# Patient Record
Sex: Female | Born: 1955
Health system: Southern US, Community
[De-identification: ages and names within clinical notes are randomized; demographics above are authoritative.]

## PROBLEM LIST (undated history)

## (undated) DIAGNOSIS — I2510 Atherosclerotic heart disease of native coronary artery without angina pectoris: Secondary | ICD-10-CM

## (undated) DIAGNOSIS — R002 Palpitations: Secondary | ICD-10-CM

## (undated) DIAGNOSIS — E119 Type 2 diabetes mellitus without complications: Secondary | ICD-10-CM

## (undated) DIAGNOSIS — I10 Essential (primary) hypertension: Secondary | ICD-10-CM

## (undated) DIAGNOSIS — E039 Hypothyroidism, unspecified: Secondary | ICD-10-CM

## (undated) DIAGNOSIS — E7800 Pure hypercholesterolemia, unspecified: Secondary | ICD-10-CM

## (undated) HISTORY — DX: Pure hypercholesterolemia, unspecified: E78.00

## (undated) HISTORY — DX: Essential (primary) hypertension: I10

## (undated) HISTORY — DX: Hypothyroidism, unspecified: E03.9

## (undated) HISTORY — PX: OTHER SURGICAL HISTORY: SHX169

## (undated) HISTORY — DX: Atherosclerotic heart disease of native coronary artery without angina pectoris: I25.10

## (undated) HISTORY — DX: Palpitations: R00.2

## (undated) HISTORY — PX: CARPAL TUNNEL RELEASE: SHX101

## (undated) HISTORY — DX: Type 2 diabetes mellitus without complications: E11.9

## (undated) HISTORY — PX: KNEE SURGERY: SHX244

---

## 2001-09-16 ENCOUNTER — Encounter: Admission: RE | Admit: 2001-09-16 | Discharge: 2001-09-16 | Payer: Self-pay | Admitting: Unknown Physician Specialty

## 2001-09-16 ENCOUNTER — Encounter: Payer: Self-pay | Admitting: Unknown Physician Specialty

## 2004-05-22 ENCOUNTER — Encounter: Admission: RE | Admit: 2004-05-22 | Discharge: 2004-05-22 | Payer: Self-pay | Admitting: Unknown Physician Specialty

## 2007-11-12 ENCOUNTER — Encounter: Admission: RE | Admit: 2007-11-12 | Discharge: 2007-11-12 | Payer: Self-pay | Admitting: Unknown Physician Specialty

## 2009-07-06 ENCOUNTER — Other Ambulatory Visit: Admission: RE | Admit: 2009-07-06 | Discharge: 2009-07-06 | Payer: Self-pay | Admitting: Family Medicine

## 2009-07-18 ENCOUNTER — Encounter: Admission: RE | Admit: 2009-07-18 | Discharge: 2009-07-18 | Payer: Self-pay | Admitting: Family Medicine

## 2010-08-28 ENCOUNTER — Encounter: Admission: RE | Admit: 2010-08-28 | Discharge: 2010-08-28 | Payer: Self-pay | Admitting: Family Medicine

## 2010-09-06 ENCOUNTER — Other Ambulatory Visit: Admission: RE | Admit: 2010-09-06 | Discharge: 2010-09-06 | Payer: Self-pay | Admitting: Family Medicine

## 2013-11-22 ENCOUNTER — Other Ambulatory Visit (HOSPITAL_COMMUNITY)
Admission: RE | Admit: 2013-11-22 | Discharge: 2013-11-22 | Disposition: A | Discharge status: Home / Self Care | Payer: BC Managed Care – PPO | Source: Ambulatory Visit | Attending: Family Medicine | Admitting: Family Medicine

## 2013-11-22 ENCOUNTER — Other Ambulatory Visit: Payer: Self-pay | Admitting: Family Medicine

## 2013-11-22 DIAGNOSIS — Z1151 Encounter for screening for human papillomavirus (HPV): Secondary | ICD-10-CM | POA: Insufficient documentation

## 2013-11-22 DIAGNOSIS — Z124 Encounter for screening for malignant neoplasm of cervix: Secondary | ICD-10-CM | POA: Insufficient documentation

## 2013-11-29 ENCOUNTER — Other Ambulatory Visit: Payer: Self-pay

## 2013-11-29 DIAGNOSIS — Z1231 Encounter for screening mammogram for malignant neoplasm of breast: Secondary | ICD-10-CM

## 2013-12-20 ENCOUNTER — Ambulatory Visit
Admission: RE | Admit: 2013-12-20 | Discharge: 2013-12-20 | Disposition: A | Discharge status: Home / Self Care | Payer: Self-pay | Source: Ambulatory Visit

## 2013-12-20 DIAGNOSIS — Z1231 Encounter for screening mammogram for malignant neoplasm of breast: Secondary | ICD-10-CM

## 2015-10-18 ENCOUNTER — Other Ambulatory Visit: Payer: Self-pay

## 2015-10-18 DIAGNOSIS — Z1231 Encounter for screening mammogram for malignant neoplasm of breast: Secondary | ICD-10-CM

## 2015-11-14 ENCOUNTER — Ambulatory Visit
Admission: RE | Admit: 2015-11-14 | Discharge: 2015-11-14 | Disposition: A | Discharge status: Home / Self Care | Payer: 59 | Source: Ambulatory Visit

## 2015-11-14 DIAGNOSIS — Z1231 Encounter for screening mammogram for malignant neoplasm of breast: Secondary | ICD-10-CM

## 2016-02-07 ENCOUNTER — Other Ambulatory Visit: Payer: Self-pay | Admitting: Gastroenterology

## 2016-03-31 DIAGNOSIS — E039 Hypothyroidism, unspecified: Secondary | ICD-10-CM | POA: Diagnosis not present

## 2016-07-02 DIAGNOSIS — E039 Hypothyroidism, unspecified: Secondary | ICD-10-CM | POA: Diagnosis not present

## 2016-12-08 ENCOUNTER — Other Ambulatory Visit (HOSPITAL_COMMUNITY)
Admission: RE | Admit: 2016-12-08 | Discharge: 2016-12-08 | Disposition: A | Discharge status: Home / Self Care | Payer: BLUE CROSS/BLUE SHIELD | Source: Ambulatory Visit | Attending: Family Medicine | Admitting: Family Medicine

## 2016-12-08 ENCOUNTER — Other Ambulatory Visit: Payer: Self-pay | Admitting: Family Medicine

## 2016-12-08 DIAGNOSIS — Z0000 Encounter for general adult medical examination without abnormal findings: Secondary | ICD-10-CM | POA: Diagnosis not present

## 2016-12-08 DIAGNOSIS — I10 Essential (primary) hypertension: Secondary | ICD-10-CM | POA: Diagnosis not present

## 2016-12-08 DIAGNOSIS — R7301 Impaired fasting glucose: Secondary | ICD-10-CM | POA: Diagnosis not present

## 2016-12-08 DIAGNOSIS — E039 Hypothyroidism, unspecified: Secondary | ICD-10-CM | POA: Diagnosis not present

## 2016-12-08 DIAGNOSIS — Z01411 Encounter for gynecological examination (general) (routine) with abnormal findings: Secondary | ICD-10-CM | POA: Diagnosis not present

## 2016-12-08 DIAGNOSIS — Z1151 Encounter for screening for human papillomavirus (HPV): Secondary | ICD-10-CM | POA: Insufficient documentation

## 2016-12-08 DIAGNOSIS — Z23 Encounter for immunization: Secondary | ICD-10-CM | POA: Diagnosis not present

## 2016-12-10 LAB — CYTOLOGY - PAP
Diagnosis: NEGATIVE
HPV: NOT DETECTED

## 2017-05-13 ENCOUNTER — Other Ambulatory Visit: Payer: Self-pay | Admitting: Family Medicine

## 2017-05-13 DIAGNOSIS — Z1231 Encounter for screening mammogram for malignant neoplasm of breast: Secondary | ICD-10-CM

## 2017-06-03 ENCOUNTER — Ambulatory Visit
Admission: RE | Admit: 2017-06-03 | Discharge: 2017-06-03 | Disposition: A | Discharge status: Home / Self Care | Payer: BLUE CROSS/BLUE SHIELD | Source: Ambulatory Visit | Attending: Family Medicine | Admitting: Family Medicine

## 2017-06-03 DIAGNOSIS — Z1231 Encounter for screening mammogram for malignant neoplasm of breast: Secondary | ICD-10-CM | POA: Diagnosis not present

## 2017-06-10 DIAGNOSIS — R7301 Impaired fasting glucose: Secondary | ICD-10-CM | POA: Diagnosis not present

## 2017-06-10 DIAGNOSIS — E039 Hypothyroidism, unspecified: Secondary | ICD-10-CM | POA: Diagnosis not present

## 2017-09-23 DIAGNOSIS — Z23 Encounter for immunization: Secondary | ICD-10-CM | POA: Diagnosis not present

## 2017-12-09 DIAGNOSIS — E039 Hypothyroidism, unspecified: Secondary | ICD-10-CM | POA: Diagnosis not present

## 2017-12-09 DIAGNOSIS — E7800 Pure hypercholesterolemia, unspecified: Secondary | ICD-10-CM | POA: Diagnosis not present

## 2017-12-09 DIAGNOSIS — R7301 Impaired fasting glucose: Secondary | ICD-10-CM | POA: Diagnosis not present

## 2017-12-09 DIAGNOSIS — I10 Essential (primary) hypertension: Secondary | ICD-10-CM | POA: Diagnosis not present

## 2017-12-09 DIAGNOSIS — Z0000 Encounter for general adult medical examination without abnormal findings: Secondary | ICD-10-CM | POA: Diagnosis not present

## 2018-10-07 ENCOUNTER — Other Ambulatory Visit: Payer: Self-pay | Admitting: Family Medicine

## 2018-10-07 DIAGNOSIS — Z1231 Encounter for screening mammogram for malignant neoplasm of breast: Secondary | ICD-10-CM

## 2018-10-21 ENCOUNTER — Ambulatory Visit
Admission: RE | Admit: 2018-10-21 | Discharge: 2018-10-21 | Disposition: A | Discharge status: Home / Self Care | Payer: BLUE CROSS/BLUE SHIELD | Source: Ambulatory Visit | Attending: Family Medicine | Admitting: Family Medicine

## 2018-10-21 DIAGNOSIS — Z1231 Encounter for screening mammogram for malignant neoplasm of breast: Secondary | ICD-10-CM | POA: Diagnosis not present

## 2018-12-15 DIAGNOSIS — F419 Anxiety disorder, unspecified: Secondary | ICD-10-CM | POA: Diagnosis not present

## 2018-12-15 DIAGNOSIS — E7800 Pure hypercholesterolemia, unspecified: Secondary | ICD-10-CM | POA: Diagnosis not present

## 2018-12-15 DIAGNOSIS — Z23 Encounter for immunization: Secondary | ICD-10-CM | POA: Diagnosis not present

## 2018-12-15 DIAGNOSIS — Z6841 Body Mass Index (BMI) 40.0 and over, adult: Secondary | ICD-10-CM | POA: Diagnosis not present

## 2018-12-15 DIAGNOSIS — R7301 Impaired fasting glucose: Secondary | ICD-10-CM | POA: Diagnosis not present

## 2018-12-15 DIAGNOSIS — Z0000 Encounter for general adult medical examination without abnormal findings: Secondary | ICD-10-CM | POA: Diagnosis not present

## 2018-12-15 DIAGNOSIS — I10 Essential (primary) hypertension: Secondary | ICD-10-CM | POA: Diagnosis not present

## 2018-12-15 DIAGNOSIS — E039 Hypothyroidism, unspecified: Secondary | ICD-10-CM | POA: Diagnosis not present

## 2019-06-15 DIAGNOSIS — R7301 Impaired fasting glucose: Secondary | ICD-10-CM | POA: Diagnosis not present

## 2019-09-21 DIAGNOSIS — E119 Type 2 diabetes mellitus without complications: Secondary | ICD-10-CM | POA: Diagnosis not present

## 2019-12-09 DIAGNOSIS — Z0000 Encounter for general adult medical examination without abnormal findings: Secondary | ICD-10-CM | POA: Diagnosis not present

## 2019-12-09 DIAGNOSIS — I10 Essential (primary) hypertension: Secondary | ICD-10-CM | POA: Diagnosis not present

## 2019-12-27 DIAGNOSIS — Z13220 Encounter for screening for lipoid disorders: Secondary | ICD-10-CM | POA: Diagnosis not present

## 2019-12-27 DIAGNOSIS — I10 Essential (primary) hypertension: Secondary | ICD-10-CM | POA: Diagnosis not present

## 2019-12-27 DIAGNOSIS — E119 Type 2 diabetes mellitus without complications: Secondary | ICD-10-CM | POA: Diagnosis not present

## 2019-12-27 DIAGNOSIS — E039 Hypothyroidism, unspecified: Secondary | ICD-10-CM | POA: Diagnosis not present

## 2020-02-27 DIAGNOSIS — R945 Abnormal results of liver function studies: Secondary | ICD-10-CM | POA: Diagnosis not present

## 2020-03-02 DIAGNOSIS — M170 Bilateral primary osteoarthritis of knee: Secondary | ICD-10-CM | POA: Diagnosis not present

## 2020-03-02 DIAGNOSIS — S83241A Other tear of medial meniscus, current injury, right knee, initial encounter: Secondary | ICD-10-CM | POA: Diagnosis not present

## 2020-06-11 DIAGNOSIS — E039 Hypothyroidism, unspecified: Secondary | ICD-10-CM | POA: Diagnosis not present

## 2020-06-11 DIAGNOSIS — R7309 Other abnormal glucose: Secondary | ICD-10-CM | POA: Diagnosis not present

## 2020-07-05 DIAGNOSIS — E119 Type 2 diabetes mellitus without complications: Secondary | ICD-10-CM | POA: Diagnosis not present

## 2020-07-27 ENCOUNTER — Other Ambulatory Visit: Payer: Self-pay | Admitting: Orthopedic Surgery

## 2020-07-27 DIAGNOSIS — M1711 Unilateral primary osteoarthritis, right knee: Secondary | ICD-10-CM | POA: Diagnosis not present

## 2020-07-27 DIAGNOSIS — M25561 Pain in right knee: Secondary | ICD-10-CM

## 2020-08-16 ENCOUNTER — Other Ambulatory Visit: Payer: Self-pay

## 2020-08-16 ENCOUNTER — Ambulatory Visit
Admission: RE | Admit: 2020-08-16 | Discharge: 2020-08-16 | Disposition: A | Discharge status: Home / Self Care | Payer: BLUE CROSS/BLUE SHIELD | Source: Ambulatory Visit | Attending: Orthopedic Surgery | Admitting: Orthopedic Surgery

## 2020-08-16 DIAGNOSIS — M25561 Pain in right knee: Secondary | ICD-10-CM

## 2020-08-22 DIAGNOSIS — M1711 Unilateral primary osteoarthritis, right knee: Secondary | ICD-10-CM | POA: Diagnosis not present

## 2020-09-13 DIAGNOSIS — E119 Type 2 diabetes mellitus without complications: Secondary | ICD-10-CM | POA: Diagnosis not present

## 2020-09-17 DIAGNOSIS — Z20822 Contact with and (suspected) exposure to covid-19: Secondary | ICD-10-CM | POA: Diagnosis not present

## 2020-09-20 DIAGNOSIS — S83241A Other tear of medial meniscus, current injury, right knee, initial encounter: Secondary | ICD-10-CM | POA: Diagnosis not present

## 2020-09-20 DIAGNOSIS — M94261 Chondromalacia, right knee: Secondary | ICD-10-CM | POA: Diagnosis not present

## 2020-09-20 DIAGNOSIS — S83231A Complex tear of medial meniscus, current injury, right knee, initial encounter: Secondary | ICD-10-CM | POA: Diagnosis not present

## 2020-09-20 DIAGNOSIS — Y999 Unspecified external cause status: Secondary | ICD-10-CM | POA: Diagnosis not present

## 2020-09-20 DIAGNOSIS — S83271A Complex tear of lateral meniscus, current injury, right knee, initial encounter: Secondary | ICD-10-CM | POA: Diagnosis not present

## 2020-09-20 DIAGNOSIS — S83281A Other tear of lateral meniscus, current injury, right knee, initial encounter: Secondary | ICD-10-CM | POA: Diagnosis not present

## 2020-09-20 DIAGNOSIS — G8918 Other acute postprocedural pain: Secondary | ICD-10-CM | POA: Diagnosis not present

## 2020-09-20 DIAGNOSIS — X58XXXA Exposure to other specified factors, initial encounter: Secondary | ICD-10-CM | POA: Diagnosis not present

## 2020-09-20 DIAGNOSIS — M1711 Unilateral primary osteoarthritis, right knee: Secondary | ICD-10-CM | POA: Diagnosis not present

## 2020-09-28 DIAGNOSIS — M1711 Unilateral primary osteoarthritis, right knee: Secondary | ICD-10-CM | POA: Diagnosis not present

## 2020-11-26 ENCOUNTER — Other Ambulatory Visit: Payer: Self-pay | Admitting: Family Medicine

## 2020-11-26 DIAGNOSIS — Z1231 Encounter for screening mammogram for malignant neoplasm of breast: Secondary | ICD-10-CM

## 2020-12-12 DIAGNOSIS — I10 Essential (primary) hypertension: Secondary | ICD-10-CM | POA: Diagnosis not present

## 2020-12-12 DIAGNOSIS — Z79899 Other long term (current) drug therapy: Secondary | ICD-10-CM | POA: Diagnosis not present

## 2020-12-12 DIAGNOSIS — Z23 Encounter for immunization: Secondary | ICD-10-CM | POA: Diagnosis not present

## 2020-12-12 DIAGNOSIS — E7800 Pure hypercholesterolemia, unspecified: Secondary | ICD-10-CM | POA: Diagnosis not present

## 2020-12-12 DIAGNOSIS — Z0000 Encounter for general adult medical examination without abnormal findings: Secondary | ICD-10-CM | POA: Diagnosis not present

## 2020-12-12 DIAGNOSIS — E119 Type 2 diabetes mellitus without complications: Secondary | ICD-10-CM | POA: Diagnosis not present

## 2020-12-12 DIAGNOSIS — E039 Hypothyroidism, unspecified: Secondary | ICD-10-CM | POA: Diagnosis not present

## 2021-01-03 ENCOUNTER — Inpatient Hospital Stay: Admission: RE | Admit: 2021-01-03 | Payer: PPO | Source: Ambulatory Visit

## 2021-01-05 ENCOUNTER — Ambulatory Visit
Admission: RE | Admit: 2021-01-05 | Discharge: 2021-01-05 | Disposition: A | Discharge status: Home / Self Care | Payer: PPO | Source: Ambulatory Visit | Attending: Family Medicine | Admitting: Family Medicine

## 2021-01-05 ENCOUNTER — Other Ambulatory Visit: Payer: Self-pay

## 2021-01-05 DIAGNOSIS — Z1231 Encounter for screening mammogram for malignant neoplasm of breast: Secondary | ICD-10-CM | POA: Diagnosis not present

## 2021-06-12 DIAGNOSIS — E039 Hypothyroidism, unspecified: Secondary | ICD-10-CM | POA: Diagnosis not present

## 2021-06-12 DIAGNOSIS — R7309 Other abnormal glucose: Secondary | ICD-10-CM | POA: Diagnosis not present

## 2021-12-20 ENCOUNTER — Other Ambulatory Visit: Payer: Self-pay | Admitting: Family Medicine

## 2021-12-20 ENCOUNTER — Other Ambulatory Visit (HOSPITAL_COMMUNITY)
Admission: RE | Admit: 2021-12-20 | Discharge: 2021-12-20 | Disposition: A | Discharge status: Home / Self Care | Payer: PPO | Source: Ambulatory Visit | Attending: Family Medicine | Admitting: Family Medicine

## 2021-12-20 DIAGNOSIS — Z01419 Encounter for gynecological examination (general) (routine) without abnormal findings: Secondary | ICD-10-CM | POA: Diagnosis not present

## 2021-12-20 DIAGNOSIS — I10 Essential (primary) hypertension: Secondary | ICD-10-CM | POA: Diagnosis not present

## 2021-12-20 DIAGNOSIS — Z1151 Encounter for screening for human papillomavirus (HPV): Secondary | ICD-10-CM | POA: Insufficient documentation

## 2021-12-20 DIAGNOSIS — E1169 Type 2 diabetes mellitus with other specified complication: Secondary | ICD-10-CM | POA: Diagnosis not present

## 2021-12-20 DIAGNOSIS — E7800 Pure hypercholesterolemia, unspecified: Secondary | ICD-10-CM | POA: Diagnosis not present

## 2021-12-20 DIAGNOSIS — Z79899 Other long term (current) drug therapy: Secondary | ICD-10-CM | POA: Diagnosis not present

## 2021-12-20 DIAGNOSIS — Z23 Encounter for immunization: Secondary | ICD-10-CM | POA: Diagnosis not present

## 2021-12-20 DIAGNOSIS — Z0001 Encounter for general adult medical examination with abnormal findings: Secondary | ICD-10-CM | POA: Diagnosis not present

## 2021-12-20 DIAGNOSIS — E039 Hypothyroidism, unspecified: Secondary | ICD-10-CM | POA: Diagnosis not present

## 2021-12-26 LAB — CYTOLOGY - PAP
Comment: NEGATIVE
Diagnosis: NEGATIVE
High risk HPV: NEGATIVE

## 2022-03-20 DIAGNOSIS — I10 Essential (primary) hypertension: Secondary | ICD-10-CM | POA: Diagnosis not present

## 2022-03-20 DIAGNOSIS — E7800 Pure hypercholesterolemia, unspecified: Secondary | ICD-10-CM | POA: Diagnosis not present

## 2022-03-20 DIAGNOSIS — E1169 Type 2 diabetes mellitus with other specified complication: Secondary | ICD-10-CM | POA: Diagnosis not present

## 2022-03-20 DIAGNOSIS — Z79899 Other long term (current) drug therapy: Secondary | ICD-10-CM | POA: Diagnosis not present

## 2022-05-04 IMAGING — MR MR KNEE*R* W/O CM
4 of 6 series · 18 of 40 positions shown · non-contrast
Comparison: None.

CLINICAL DATA: Right inferior and medial right knee pain for 6
months

EXAM:
MRI OF THE RIGHT KNEE WITHOUT CONTRAST
TECHNIQUE: Multiplanar, multisequence MR imaging of the knee was performed. No
intravenous contrast was administered.

[Series 3: T2 fat-sat · axial · 4.0mm · 0.31mm/px · z∈[-76,-2]mm · 3 of 27 slices shown (1 of 2)]
[im 5/27]
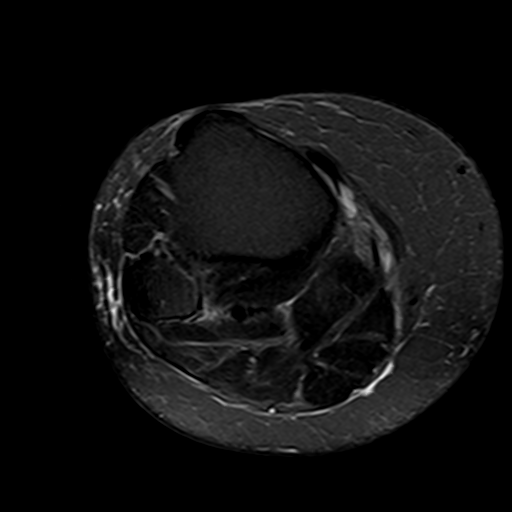
[im 14/27]
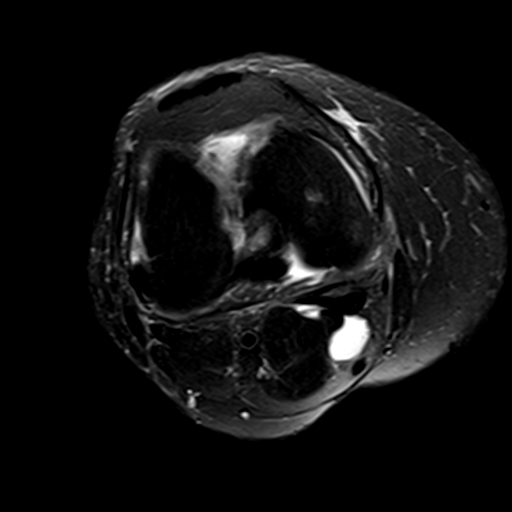
[im 22/27]
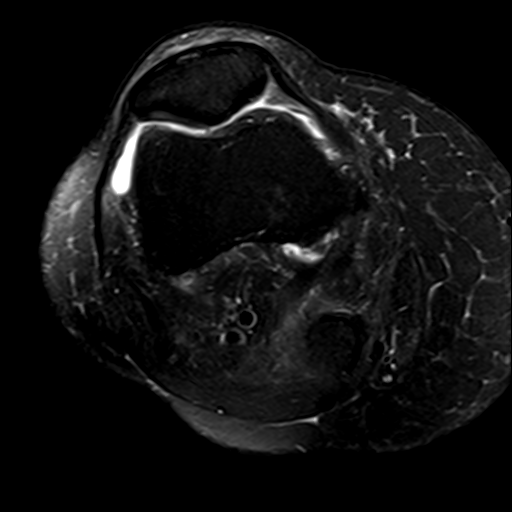

[Series 5: T2 fat-sat · coronal · 4.0mm · 0.29mm/px · 3 of 28 slices shown (2 of 2)]
[im 6/28]
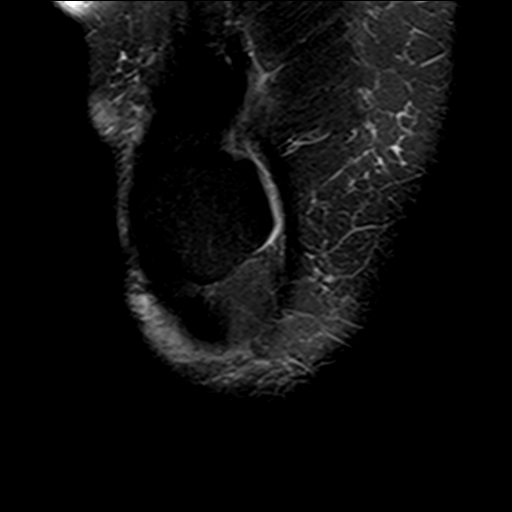
[im 17/28]
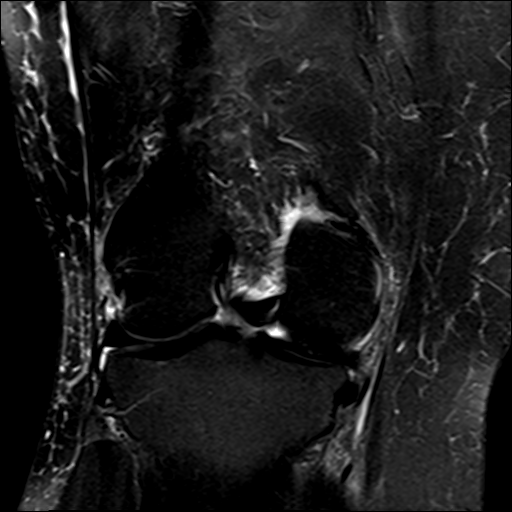
[im 28/28]
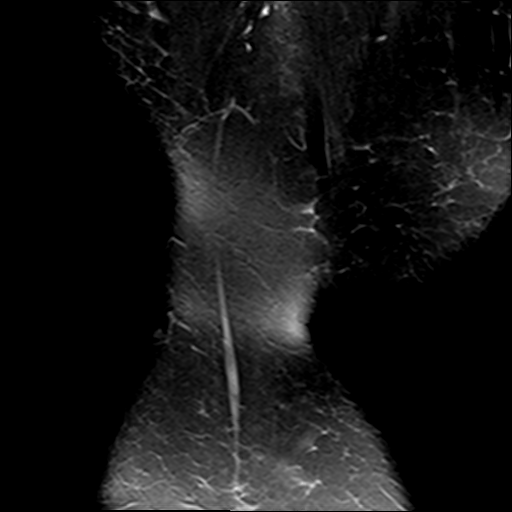

[Series 6: PD fat-sat · coronal · 3.0mm · 0.29mm/px · 8 of 33 slices shown (1 of 2)]
[im 1/33]
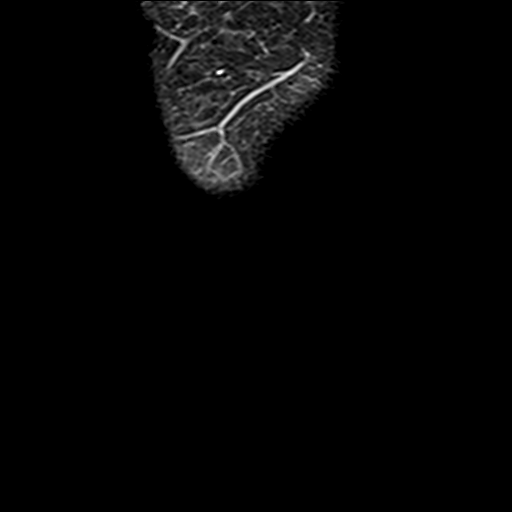
[im 5/33]
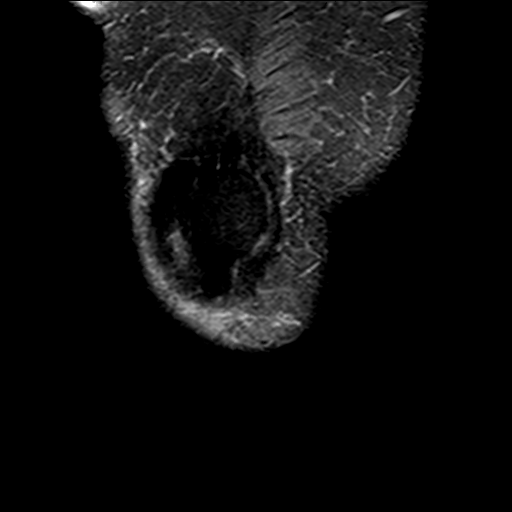
[im 10/33]
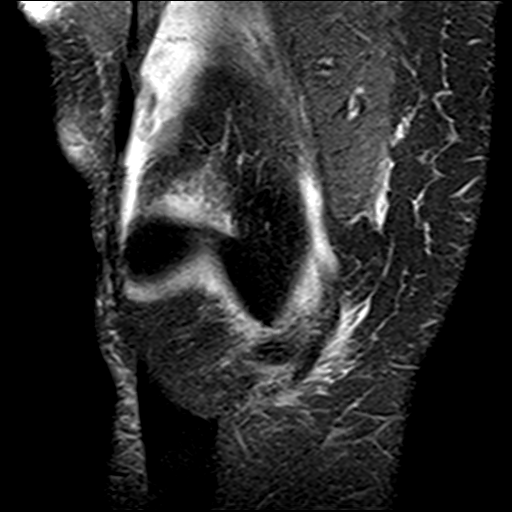
[im 14/33]
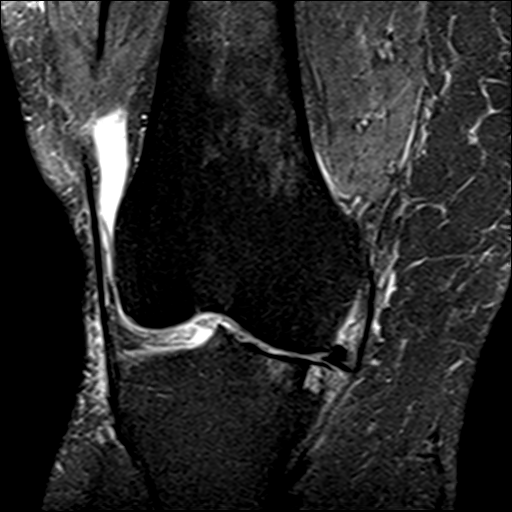
[im 19/33]
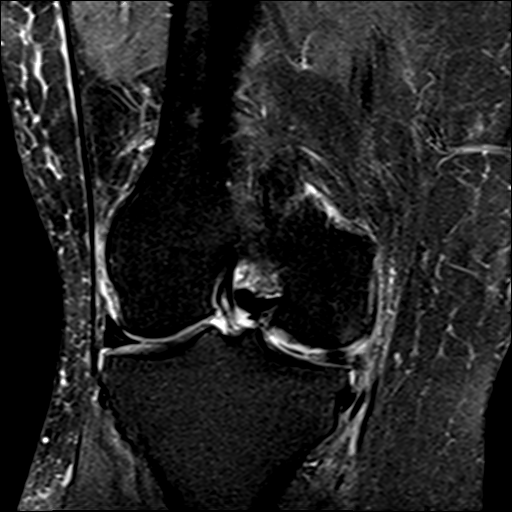
[im 23/33]
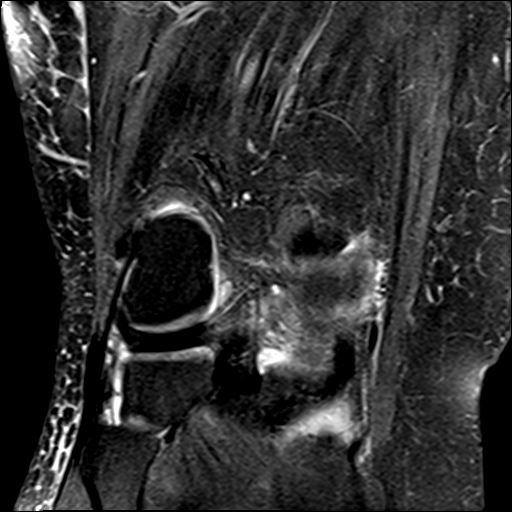
[im 28/33]
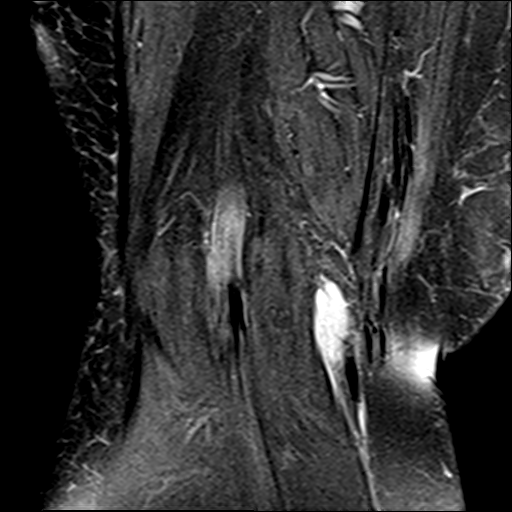
[im 33/33]
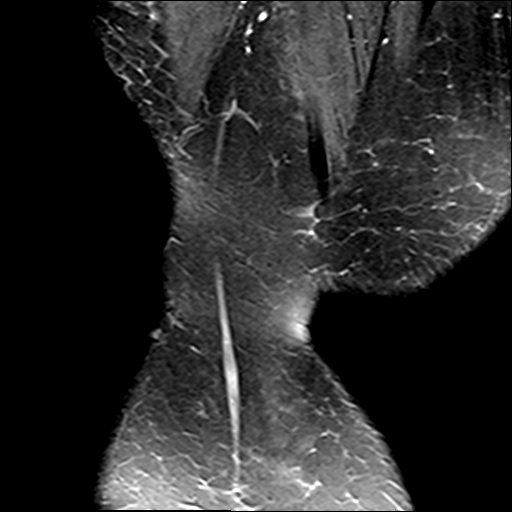

[Series 7: PD fat-sat · sagittal · 3.0mm · 0.29mm/px · 4 of 30 slices shown (2 of 2)]
[im 1/30]
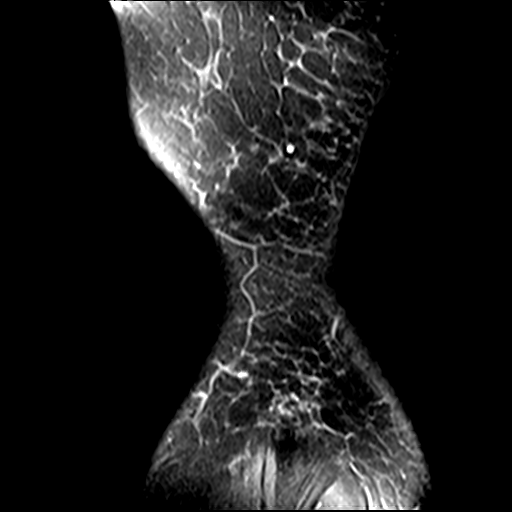
[im 5/30]
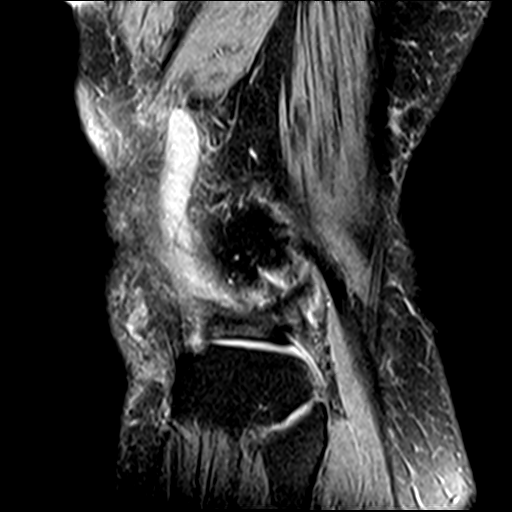
[im 15/30]
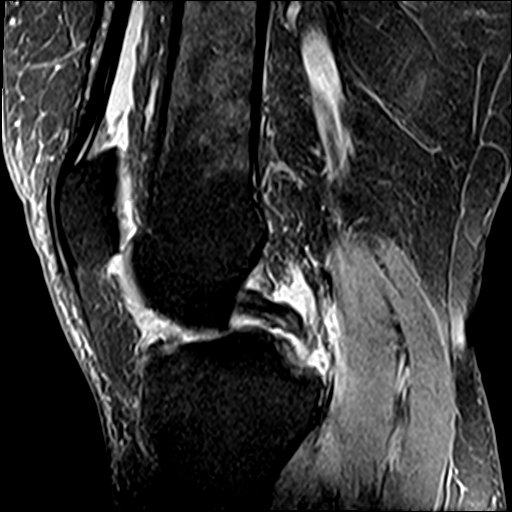
[im 25/30]
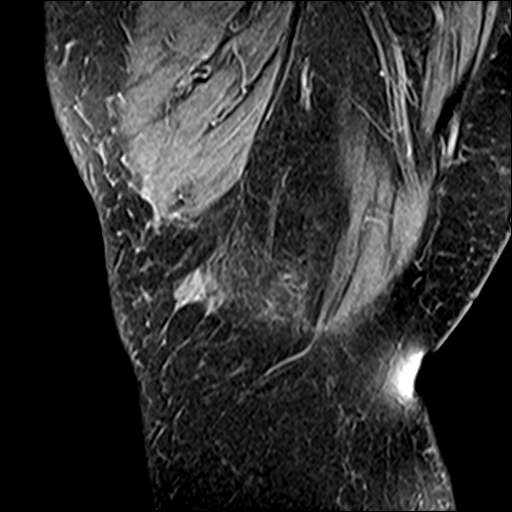

[18 of 40 positions shown; findings below may reference images not displayed]

FINDINGS: MENISCI

Medial: Large complex tear of the body of medial meniscus with a
radial component and an oblique component extending into the
posterior horn of medial meniscus and to the inferior articular
surface of the posterior horn.

Lateral: Small undersurface tear of the anterior horn of the lateral
meniscus.

LIGAMENTS

Cruciates: ACL and PCL are intact.

Collaterals: Medial collateral ligament is intact. Lateral
collateral ligament complex is intact.

CARTILAGE

Patellofemoral: Partial-thickness cartilage loss of the patellar
apex and lateral patellar facet.

Medial: High-grade partial-thickness cartilage loss with areas of
full-thickness cartilage loss of the medial femoral condyle with
subchondral reactive marrow changes. Partial-thickness cartilage
loss of the medial tibial plateau with subchondral reactive marrow
edema.

Lateral:  No chondral defect.

JOINT: Large joint effusion. Normal Jacques Brisco. No plical
thickening.

POPLITEAL FOSSA: Popliteus tendon is intact. Tiny Hoffa's fat.

EXTENSOR MECHANISM: Intact quadriceps tendon. Intact patellar
tendon. Intact lateral patellar retinaculum. Intact medial patellar
retinaculum. Intact MPFL.

BONES: No aggressive osseous lesion. No fracture or dislocation.

Other: No fluid collection or hematoma. Muscles are normal.
IMPRESSION: 1. Large complex tear of the body of medial meniscus with a radial
component and an oblique component extending into the posterior horn
of medial meniscus and to the inferior articular surface of the
posterior horn.
2. Small undersurface tear of the anterior horn of the lateral
meniscus.
3. High-grade partial-thickness cartilage loss with areas of
full-thickness cartilage loss of the medial femoral condyle with
subchondral reactive marrow changes. Partial-thickness cartilage
loss of the medial tibial plateau with subchondral reactive marrow
edema.
4. Partial-thickness cartilage loss of the patellar apex and lateral
patellar facet.
5. Large joint effusion.

## 2022-07-24 ENCOUNTER — Other Ambulatory Visit (HOSPITAL_BASED_OUTPATIENT_CLINIC_OR_DEPARTMENT_OTHER): Payer: Self-pay | Admitting: Family Medicine

## 2022-07-24 ENCOUNTER — Other Ambulatory Visit: Payer: Self-pay | Admitting: Family Medicine

## 2022-07-24 DIAGNOSIS — E7800 Pure hypercholesterolemia, unspecified: Secondary | ICD-10-CM

## 2022-07-24 DIAGNOSIS — E1169 Type 2 diabetes mellitus with other specified complication: Secondary | ICD-10-CM | POA: Diagnosis not present

## 2022-07-24 DIAGNOSIS — I10 Essential (primary) hypertension: Secondary | ICD-10-CM | POA: Diagnosis not present

## 2022-07-24 DIAGNOSIS — Z79899 Other long term (current) drug therapy: Secondary | ICD-10-CM | POA: Diagnosis not present

## 2022-07-24 DIAGNOSIS — E039 Hypothyroidism, unspecified: Secondary | ICD-10-CM | POA: Diagnosis not present

## 2022-07-24 DIAGNOSIS — G720 Drug-induced myopathy: Secondary | ICD-10-CM | POA: Diagnosis not present

## 2022-08-27 ENCOUNTER — Encounter (HOSPITAL_BASED_OUTPATIENT_CLINIC_OR_DEPARTMENT_OTHER): Payer: Self-pay

## 2022-08-27 ENCOUNTER — Ambulatory Visit (HOSPITAL_BASED_OUTPATIENT_CLINIC_OR_DEPARTMENT_OTHER)
Admission: RE | Admit: 2022-08-27 | Discharge: 2022-08-27 | Disposition: A | Discharge status: Home / Self Care | Payer: PPO | Source: Ambulatory Visit | Attending: Family Medicine | Admitting: Family Medicine

## 2022-08-27 DIAGNOSIS — E7800 Pure hypercholesterolemia, unspecified: Secondary | ICD-10-CM | POA: Insufficient documentation

## 2022-10-14 ENCOUNTER — Encounter: Payer: Self-pay | Admitting: Cardiology

## 2022-10-14 ENCOUNTER — Ambulatory Visit: Payer: PPO | Attending: Cardiology | Admitting: Cardiology

## 2022-10-14 VITALS — BP 130/70 | HR 73 | Ht 64.0 in | Wt 249.0 lb

## 2022-10-14 DIAGNOSIS — I10 Essential (primary) hypertension: Secondary | ICD-10-CM

## 2022-10-14 DIAGNOSIS — I2510 Atherosclerotic heart disease of native coronary artery without angina pectoris: Secondary | ICD-10-CM | POA: Diagnosis not present

## 2022-10-14 DIAGNOSIS — I2584 Coronary atherosclerosis due to calcified coronary lesion: Secondary | ICD-10-CM

## 2022-10-14 DIAGNOSIS — E119 Type 2 diabetes mellitus without complications: Secondary | ICD-10-CM

## 2022-10-14 DIAGNOSIS — Z789 Other specified health status: Secondary | ICD-10-CM

## 2022-10-14 MED ORDER — EZETIMIBE 10 MG PO TABS: 10.0000 mg | ORAL_TABLET | Freq: Every day | ORAL | 3 refills | Status: DC

## 2022-10-14 NOTE — Progress Notes (Signed)
Cardiology Office Note:    Date:  10/17/2022   ID:  Valerie Short, DOB [birth year], MRN [id number]  PCP:  Lujean Amel, [facility] Cardiologist:  Candee Furbish, MD     Referring MD: Lujean Amel, MD     History of Present Illness:    Valerie Short is a 66 y.o. female with a hx of hypertension, coronary artery disease, diabetes mellitus, hyperlipidemia and thyroid disease here for evaluation of CAD with a recent coronary calcium score of 121.   Today, she reports that she is doing well. She had the coronary calcium score done at the recommendation of her primary, as a preventative medicine. She is not having any issues with her heart at this time.   She is currently taking hydrochlorothiazide, metoprolol, and metformin. She was started on losartan but she did not tolerate the medication. She was unable to lift her arms above shoulder level with out pain.   Her mother had a history of atrial fibrillation and her father has open heart surgery at one point. They both passed in their mid 14s.   She denies any palpitations, chest pain, shortness of breath, or peripheral edema. No lightheadedness, headaches, syncope, orthopnea, or PND.    Past Medical History:  Diagnosis Date   CAD (coronary artery disease)    DM (diabetes mellitus) ([place])    HTN (hypertension)    Hypercholesterolemia    Hypothyroidism    Palpitations     Past Surgical History:  Procedure Laterality Date   broken finger     CARPAL TUNNEL RELEASE Left    KNEE SURGERY Left    right knee artroscope     tubal ligation      Current Medications: Current Meds  Medication Sig   ALPRAZolam (NIRAVAM) 0.25 MG dissolvable tablet Take 0.25 mg by mouth at bedtime as needed for anxiety.   Cinnamon 500 MG TABS Take by mouth.   co-enzyme Q-10 30 MG capsule Take 30 mg by mouth 3 (three) times daily.   ezetimibe (ZETIA) 10 MG tablet Take 1 tablet (10 mg total) by mouth daily.   hydrochlorothiazide  (HYDRODIURIL) 12.5 MG tablet Take 12.5 mg by mouth daily.   levothyroxine (SYNTHROID) 125 MCG tablet Take 125 mcg by mouth daily before breakfast.   losartan (COZAAR) 50 MG tablet Take 50 mg by mouth daily.   metFORMIN (GLUCOPHAGE) 500 MG tablet Take 500 mg by mouth 2 (two) times daily with a meal.   metoprolol tartrate (LOPRESSOR) 50 MG tablet Take 50 mg by mouth daily.   MULTIPLE VITAMIN PO Take by mouth.   naproxen sodium (ALEVE) 220 MG tablet Take 220 mg by mouth.   Probiotic Product (ALIGN) 4 MG CAPS Take by mouth.     Allergies:   Angiotensin receptor blockers, Aspirin, Crestor [rosuvastatin], Lipitor [atorvastatin], and Simvastatin   Social History   Socioeconomic History   Marital status: Single    Spouse name: Not on file   Number of children: Not on file   Years of education: Not on file   Highest education level: Not on file  Occupational History   Not on file  Tobacco Use   Smoking status: Former    Types: Cigarettes   Smokeless tobacco: Never  Substance and Sexual Activity   Alcohol use: Yes   Drug use: Never   Sexual activity: Not on file  Other Topics Concern   Not on file  Social History Narrative   Not on  file   Social Determinants of Health   Financial Resource Strain: Not on file  Food Insecurity: Not on file  Transportation Needs: Not on file  Physical Activity: Not on file  Stress: Not on file  Social Connections: Not on file     Family History: Father had bypass, mother had atrial fibrillation  ROS:   Please see the history of present illness.    All other systems reviewed and are negative.  EKGs/Labs/Other Studies Reviewed:    The following studies were reviewed today:  Coronary Calcium Score 08/27/2022: IMPRESSION: Coronary calcium score of 121. This was 80th percentile for age-, race-, and sex-matched controls.   Mild aortic atherosclerosis.   Recommend aggressive risk factor modification, including LDL goal <70.  EKG: EKG is  personally reviewed.  10/14/2022: Sinus rhythm, heart rate 73 bpm  Recent Labs: No results found for requested labs within last 365 days.  Recent Lipid Panel No results found for: "CHOL", "TRIG", "HDL", "CHOLHDL", "VLDL", "LDLCALC", "LDLDIRECT"   Risk Assessment/Calculations:           Physical Exam:    VS:  BP 130/70 (BP Location: Left Arm, Patient Position: Sitting, Cuff Size: Normal)   Pulse 73   Ht 5\' 4"  (1.626 m)   Wt 249 lb (112.9 kg)   BMI 42.74 kg/m     Wt Readings from Last 3 Encounters:  10/14/22 249 lb (112.9 kg)     GEN:  Well nourished, well developed in no acute distress HEENT: Normal NECK: No JVD; No carotid bruits LYMPHATICS: No lymphadenopathy CARDIAC: RRR, no murmurs, rubs, gallops RESPIRATORY:  Clear to auscultation without rales, wheezing or rhonchi  ABDOMEN: Soft, non-tender, non-distended MUSCULOSKELETAL:  No edema; No deformity  SKIN: Warm and dry NEUROLOGIC:  Alert and oriented x 3 PSYCHIATRIC:  Normal affect   ASSESSMENT:    1. Statin intolerance   2. Coronary artery calcification   3. Morbid obesity (HCC)   4. Diabetes mellitus with coincident hypertension (HCC)    PLAN:    In order of problems listed above:  Coronary artery calcification Score 121.  We will go ahead and promote plaque stabilization.  She is unable to take aspirin 81 mg due to stomach upset.  She has not been able to take statin medications in the past, had weakness pain when trying to raise her arms above her shoulders.  We will go ahead and start Zetia 10 mg once a day.  I will refer to our lipid clinic to discuss other options.  Perhaps PCSK9 inhibitor or bempedoic acid.  LDL goal less than 70.  Most recent LDL was 108.  Diabetes with hypertension Last hemoglobin A1c 6.5.  On metformin.  Taking hydrochlorothiazide as well as losartan.  Blood pressure under good control today.  Continue with activity.  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  Morbid  obesity Continue to encourage weight loss.  Former smoker Quit in 1990.  Excellent.  Watch for any signs or symptoms of chest discomfort.  Currently she is doing well.  Thankful that she obtained her calcium score.        Follow Up: 3 month APP, 6 months with me.   Medication Adjustments/Labs and Tests Ordered: Current medicines are reviewed at length with the patient today.  Concerns regarding medicines are outlined above.  Orders Placed This Encounter  Procedures   AMB Referral to [facility]   EKG 12-Lead   Meds ordered this encounter  Medications   ezetimibe (ZETIA)  10 MG tablet    Sig: Take 1 tablet (10 mg total) by mouth daily.    Dispense:  90 tablet    Refill:  3    Patient Instructions  Medication Instructions:  Your physician has recommended you make the following change in your medication: Start taking Zetia 10 mg daily.  *If you need a refill on your cardiac medications before your next appointment, please call your pharmacy*   Lab Work: None. If you have labs (blood work) drawn today and your tests are completely normal, you will receive your results only by: [place] (if you have MyChart) OR A paper copy in the mail If you have any lab test that is abnormal or we need to change your treatment, we will call you to review the results.   Testing/Procedures: None.   Follow-Up: At [facility], you and your health needs are our priority.  As part of our continuing mission to provide you with exceptional heart care, we have created designated Provider Care Teams.  These Care Teams include your primary Cardiologist (physician) and Advanced Practice Providers (APPs -  Physician Assistants and Nurse Practitioners) who all work together to provide you with the care you need, when you need it.  We recommend signing up for the patient portal called "MyChart".  Sign up information is provided on this After Visit Summary.  MyChart is used to  connect with patients for Virtual Visits (Telemedicine).  Patients are able to view lab/test results, encounter notes, upcoming appointments, etc.  Non-urgent messages can be sent to your provider as well.   To learn more about what you can do with MyChart, go to [email or web address].    Your next appointment:   6 month(s)  The format for your next appointment:   In Person  Provider:   Richardson Dopp, PA.   You will also need to follow up with Dr. Candee Furbish in one year.  Other Instructions You have been referred to our lipid clinic, which is run by our pharmacist at the [facility] office. They will call you to set up an appointment.  Important Information About Sugar          I,Jessica Ford,acting as a scribe for [employer], MD.,have documented all relevant documentation on the behalf of Candee Furbish, MD,as directed by  Candee Furbish, MD while in the presence of Candee Furbish, MD.   I, Candee Furbish, MD, have reviewed all documentation for this visit. The documentation on 10/17/22 for the exam, diagnosis, procedures, and orders are all accurate and complete.   Signed, Candee Furbish, MD  10/17/2022 6:59 AM    [place]

## 2022-10-14 NOTE — Patient Instructions (Signed)
Medication Instructions:  Your physician has recommended you make the following change in your medication: Start taking Zetia 10 mg daily.  *If you need a refill on your cardiac medications before your next appointment, please call your pharmacy*   Lab Work: None. If you have labs (blood work) drawn today and your tests are completely normal, you will receive your results only by: MyChart Message (if you have MyChart) OR A paper copy in the mail If you have any lab test that is abnormal or we need to change your treatment, we will call you to review the results.   Testing/Procedures: None.   Follow-Up: At [facility], you and your health needs are our priority.  As part of our continuing mission to provide you with exceptional heart care, we have created designated Provider Care Teams.  These Care Teams include your primary Cardiologist (physician) and Advanced Practice Providers (APPs -  Physician Assistants and Nurse Practitioners) who all work together to provide you with the care you need, when you need it.  We recommend signing up for the patient portal called "MyChart".  Sign up information is provided on this After Visit Summary.  MyChart is used to connect with patients for Virtual Visits (Telemedicine).  Patients are able to view lab/test results, encounter notes, upcoming appointments, etc.  Non-urgent messages can be sent to your provider as well.   To learn more about what you can do with MyChart, go to [email or web address].    Your next appointment:   6 month(s)  The format for your next appointment:   In Person  Provider:   Tereso Newcomer, PA.   You will also need to follow up with Dr. Donato Schultz in one year.  Other Instructions You have been referred to our lipid clinic, which is run by our pharmacist at the [facility] office. They will call you to set up an appointment.  Important Information About Sugar

## 2022-12-02 NOTE — Progress Notes (Signed)
Patient ID: Valerie Short                 DOB: 03-25-1956                    MRN: [id number]      HPI: Valerie Short is a 67 y.o. female patient referred to lipid clinic by Dr.Skains. PMH is significant for hypertension, coronary artery disease, diabetes mellitus, hyperlipidemia and thyroid disease. She is here for evaluation of her cholesterol and diabetes/obesity medications  Patient reports she has tried multiple statins in the past she could not tolerate any of them. She had tried Crestor and develop sever muscle cramps and pain within couple weeks so she was put on Lipitor and she experienced same symptoms. 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 Dr. Marlou Porch put her on ezetimibe and she can tolerate without any issue. She has cut down oin lots of bread and eats small portions. She has lost 25 lbs in past 6 months    Diet: eats smaller portions than she use to, cut down on lots of carbs (breads). Eats lot os pinto beans,green beans eat as much as green she could, does not add lots of dressing on her greens. Loves eating ice cream in summer time Drink- water once in while 1-2 soda Snacks- generally fruits  Meat - grilled or Instapot or slow cooker  Eat out- once  or twice month   Exercise: none  Motivated to start regular walks and resistance exercise  Family History: both father and mother had heart disease   Social History:  Alcohol: none Smoking : none quit in [place]: Lipid Panel  LDLc- 108,TC - 173, HDL- 44, TG-117 on 07/24/2022  Past Medical History:  Diagnosis Date   CAD (coronary artery disease)    DM (diabetes mellitus) ([place])    HTN (hypertension)    Hypercholesterolemia    Hypothyroidism    Palpitations     Current Outpatient Medications on File Prior to Visit   Medication Sig Dispense Refill   ALPRAZolam (NIRAVAM) 0.25 MG dissolvable tablet Take 0.25 mg by mouth at bedtime as needed for anxiety.     Cinnamon 500 MG TABS Take by mouth.     co-enzyme Q-10 30 MG capsule Take 30 mg by mouth 3 (three) times daily.     ezetimibe (ZETIA) 10 MG tablet Take 1 tablet (10 mg total) by mouth daily. 90 tablet 3   hydrochlorothiazide (HYDRODIURIL) 12.5 MG tablet Take 12.5 mg by mouth daily.     levothyroxine (SYNTHROID) 125 MCG tablet Take 125 mcg by mouth daily before breakfast.     losartan (COZAAR) 50 MG tablet Take 50 mg by mouth daily.     metFORMIN (GLUCOPHAGE) 500 MG tablet Take 500 mg by mouth 2 (two) times daily with a meal.     metoprolol tartrate (LOPRESSOR) 50 MG tablet Take 50 mg by mouth daily.     MULTIPLE VITAMIN PO Take by mouth.     naproxen sodium (ALEVE) 220 MG tablet Take 220 mg by mouth.     Probiotic Product (ALIGN) 4 MG CAPS Take by mouth.     No current facility-administered medications on file prior to visit.    Allergies  Allergen Reactions  Angiotensin Receptor Blockers Cough   Aspirin Other (See Comments)    Stomach upset   Crestor [Rosuvastatin] Other (See Comments)    Body aches   Lipitor [Atorvastatin]     Body aches   Simvastatin     Body aches    Assessment/Plan:  1. Hyperlipidemia -  Problem  Hyperlipidemia Ldl Goal <70   Current Medications: Zetia 10 mg daily  Intolerances: Crestor, Lipitor, simvastatin- gets severe muscle aches and cramps  Risk Factors: Coronary artery disease (CAD), CAC score 121, family hx of heart disease  LDL goal: <70 mg/dl  Labs: LDLc- 108,TC - 173, HDL- 44, TG-117 on 07/24/2022   Type 2 Diabetes Mellitus With Complication, Without Long-Term Current Use of Insulin (Hcc)  Obesity   Hyperlipidemia LDL goal <70 Assessment:  LDL goal: < 70 mg/dl last LDLc 108 mg/dl (07/24/2022)  Tolerates Zetia well without any side effects  Intolerance to  statins Crestor, Lipitor, simvastatin-  gets severe muscle aches and cramps Discussed next potential options (PCSK-9 inhibitors, bempedoic acid and inclisiran); cost, dosing,efficacy, side effects  Patient wants to try PCSK9i   Plan: Continue taking current medications (Zetia 10 mg daily) Will apply for PA for PCSK9i; will inform patient upon approval  Lipid lab due in 2-3 months after starting PCSK9i\   Obesity Assessment/Plan  Patient have been trying to loose weight by making some dietary changes  Does not do regular exercise  lost 25 lbs in last 6 months  Last A1c 6.5 on metformin, discuss GLP1 agent role in diabetes and how it helps with weight loss too Patient wants to try diet and exercise first before going on GLP1, will be starting regular exercise 30-40 min every other day Offer referral to nutritionist - patient is not interested due to her busy work schedule.    Thank you,  Cammy Copa, Pharm.D  [facility] [address], [place], [place] [address]  Phone: [phone]; Fax: [phone]

## 2022-12-03 ENCOUNTER — Ambulatory Visit: Payer: PPO | Attending: Cardiovascular Disease | Admitting: Student

## 2022-12-03 ENCOUNTER — Telehealth: Payer: Self-pay | Admitting: Pharmacist

## 2022-12-03 DIAGNOSIS — E118 Type 2 diabetes mellitus with unspecified complications: Secondary | ICD-10-CM | POA: Insufficient documentation

## 2022-12-03 DIAGNOSIS — E785 Hyperlipidemia, unspecified: Secondary | ICD-10-CM | POA: Diagnosis not present

## 2022-12-03 DIAGNOSIS — E669 Obesity, unspecified: Secondary | ICD-10-CM | POA: Insufficient documentation

## 2022-12-03 NOTE — Assessment & Plan Note (Addendum)
Assessment:  LDL goal: < 70 mg/dl last LDLc 108 mg/dl (07/24/2022)  Tolerates Zetia well without any side effects  Intolerance to  statins Crestor, Lipitor, simvastatin- gets severe muscle aches and cramps Discussed next potential options (PCSK-9 inhibitors, bempedoic acid and inclisiran); cost, dosing,efficacy, side effects  Patient wants to try PCSK9i   Plan: Continue taking current medications (Zetia 10 mg daily) Will apply for PA for PCSK9i; will inform patient upon approval  Lipid lab due in 2-3 months after starting PCSK9i\

## 2022-12-03 NOTE — Patient Instructions (Signed)
Your Results:             Your most recent labs Goal  Total Cholesterol 117 < 200  Triglycerides 173 < 150  HDL (happy/good cholesterol) 44 > 40  LDL (lousy/bad cholesterol 108 < 70   Medication changes: We will start the process to get PCSK9i (Repatha or Praluent) covered by your insurance.  Once the prior authorization is complete, we will call you to let you know and confirm pharmacy information.     Praluent is a cholesterol medication that improved your body's ability to get rid of "bad cholesterol" known as LDL. It can lower your LDL up to 60%. It is an injection that is given under the skin every 2 weeks. The most common side effects of Praluent include runny nose, symptoms of the common cold, rarely flu or flu-like symptoms, back/muscle pain in about 3-4% of the patients, and redness, pain, or bruising at the injection site.    Repatha is a cholesterol medication that improved your body's ability to get rid of "bad cholesterol" known as LDL. It can lower your LDL up to 60%! It is an injection that is given under the skin every 2 weeks. The most common side effects of Repatha include runny nose, symptoms of the common cold, rarely flu or flu-like symptoms, back/muscle pain in about 3-4% of the patients, and redness, pain, or bruising at the injection site.   Lab orders: We want to repeat labs after 2-3 months.  We will send you a lab order to remind you once we get closer to that time.        Copay Assistance:  The [employer] foundation offers assistance to help pay for medication copays.  They will cover copays for all cholesterol lowering meds, including statins, fibrates, omega-3 oils, ezetimibe, Repatha, Praluent, Nexletol, Nexlizet.  The cards are usually good for $2,500 or 12 months, whichever comes first. Go to healthwellfoundation.org Click on "Apply Now" Answer questions as to whom is applying (patient or representative) Your disease fund will be "hypercholesterolemia -  Medicare access" Select the cholesterol medication you need assistance with (Repatha, Praluent, Nexlizet...) They will ask question about qualifying diagnosis - you can mark "yes"; and do you have insurance coverage.   When they ask what type of assistance you are interested in - "copay assistance" When you submit, the approval is usually within minutes.  You will need to print the card information from the site You will need to show this information to your pharmacy, they will bill your Medicare Part D plan first -then bill [employer] --for the copay.   You can also call them at [phone], although the hold times can be quite long.

## 2022-12-03 NOTE — Assessment & Plan Note (Signed)
Assessment/Plan  Patient have been trying to loose weight by making some dietary changes  Does not do regular exercise  lost 25 lbs in last 6 months  Last A1c 6.5 on metformin, discuss GLP1 agent role in diabetes and how it helps with weight loss too Patient wants to try diet and exercise first before going on GLP1, will be starting regular exercise 30-40 min every other day Offer referral to nutritionist - patient is not interested due to her busy work schedule.

## 2022-12-03 NOTE — Telephone Encounter (Addendum)
Repatha PA submitted  (Key: [id number])  PA approved 17-JAN-24 to 17-JUL-24

## 2022-12-08 MED ORDER — REPATHA SURECLICK 140 MG/ML ~~LOC~~ SOAJ: 140.0000 mg | SUBCUTANEOUS | 3 refills | Status: DC

## 2022-12-08 NOTE — Addendum Note (Signed)
Addended by: Anda Latina on: 12/08/2022 07:46 AM   Modules accepted: Orders

## 2022-12-17 ENCOUNTER — Other Ambulatory Visit: Payer: Self-pay | Admitting: Family Medicine

## 2022-12-17 DIAGNOSIS — Z1231 Encounter for screening mammogram for malignant neoplasm of breast: Secondary | ICD-10-CM

## 2022-12-18 ENCOUNTER — Ambulatory Visit
Admission: RE | Admit: 2022-12-18 | Discharge: 2022-12-18 | Disposition: A | Discharge status: Home / Self Care | Payer: PPO | Source: Ambulatory Visit | Attending: Family Medicine | Admitting: Family Medicine

## 2022-12-18 DIAGNOSIS — Z1231 Encounter for screening mammogram for malignant neoplasm of breast: Secondary | ICD-10-CM

## 2022-12-25 DIAGNOSIS — Z0001 Encounter for general adult medical examination with abnormal findings: Secondary | ICD-10-CM | POA: Diagnosis not present

## 2022-12-25 DIAGNOSIS — E2839 Other primary ovarian failure: Secondary | ICD-10-CM | POA: Diagnosis not present

## 2022-12-25 DIAGNOSIS — I10 Essential (primary) hypertension: Secondary | ICD-10-CM | POA: Diagnosis not present

## 2022-12-25 DIAGNOSIS — G720 Drug-induced myopathy: Secondary | ICD-10-CM | POA: Diagnosis not present

## 2022-12-25 DIAGNOSIS — E119 Type 2 diabetes mellitus without complications: Secondary | ICD-10-CM | POA: Diagnosis not present

## 2022-12-25 DIAGNOSIS — E1169 Type 2 diabetes mellitus with other specified complication: Secondary | ICD-10-CM | POA: Diagnosis not present

## 2022-12-25 DIAGNOSIS — E7800 Pure hypercholesterolemia, unspecified: Secondary | ICD-10-CM | POA: Diagnosis not present

## 2022-12-25 DIAGNOSIS — Z6841 Body Mass Index (BMI) 40.0 and over, adult: Secondary | ICD-10-CM | POA: Diagnosis not present

## 2022-12-25 DIAGNOSIS — Z79899 Other long term (current) drug therapy: Secondary | ICD-10-CM | POA: Diagnosis not present

## 2022-12-25 DIAGNOSIS — E039 Hypothyroidism, unspecified: Secondary | ICD-10-CM | POA: Diagnosis not present

## 2022-12-25 DIAGNOSIS — I700 Atherosclerosis of aorta: Secondary | ICD-10-CM | POA: Diagnosis not present

## 2022-12-31 ENCOUNTER — Other Ambulatory Visit: Payer: Self-pay | Admitting: Family Medicine

## 2022-12-31 DIAGNOSIS — E2839 Other primary ovarian failure: Secondary | ICD-10-CM

## 2023-01-08 DIAGNOSIS — E119 Type 2 diabetes mellitus without complications: Secondary | ICD-10-CM | POA: Diagnosis not present

## 2023-01-08 DIAGNOSIS — Z7984 Long term (current) use of oral hypoglycemic drugs: Secondary | ICD-10-CM | POA: Diagnosis not present

## 2023-01-08 DIAGNOSIS — H524 Presbyopia: Secondary | ICD-10-CM | POA: Diagnosis not present

## 2023-01-08 LAB — HM DIABETES EYE EXAM

## 2023-03-24 DIAGNOSIS — D124 Benign neoplasm of descending colon: Secondary | ICD-10-CM | POA: Diagnosis not present

## 2023-03-24 DIAGNOSIS — K529 Noninfective gastroenteritis and colitis, unspecified: Secondary | ICD-10-CM | POA: Diagnosis not present

## 2023-03-24 DIAGNOSIS — Z86010 Personal history of colonic polyps: Secondary | ICD-10-CM | POA: Diagnosis not present

## 2023-03-24 DIAGNOSIS — K648 Other hemorrhoids: Secondary | ICD-10-CM | POA: Diagnosis not present

## 2023-03-24 DIAGNOSIS — Z09 Encounter for follow-up examination after completed treatment for conditions other than malignant neoplasm: Secondary | ICD-10-CM | POA: Diagnosis not present

## 2023-03-24 DIAGNOSIS — D125 Benign neoplasm of sigmoid colon: Secondary | ICD-10-CM | POA: Diagnosis not present

## 2023-03-24 DIAGNOSIS — K5730 Diverticulosis of large intestine without perforation or abscess without bleeding: Secondary | ICD-10-CM | POA: Diagnosis not present

## 2023-03-24 DIAGNOSIS — K6389 Other specified diseases of intestine: Secondary | ICD-10-CM | POA: Diagnosis not present

## 2023-03-26 DIAGNOSIS — D125 Benign neoplasm of sigmoid colon: Secondary | ICD-10-CM | POA: Diagnosis not present

## 2023-03-26 DIAGNOSIS — D124 Benign neoplasm of descending colon: Secondary | ICD-10-CM | POA: Diagnosis not present

## 2023-06-01 ENCOUNTER — Ambulatory Visit
Admission: RE | Admit: 2023-06-01 | Discharge: 2023-06-01 | Disposition: A | Discharge status: Home / Self Care | Payer: PPO | Source: Ambulatory Visit | Attending: Family Medicine | Admitting: Family Medicine

## 2023-06-01 DIAGNOSIS — E349 Endocrine disorder, unspecified: Secondary | ICD-10-CM | POA: Diagnosis not present

## 2023-06-01 DIAGNOSIS — E2839 Other primary ovarian failure: Secondary | ICD-10-CM

## 2023-06-01 DIAGNOSIS — N958 Other specified menopausal and perimenopausal disorders: Secondary | ICD-10-CM | POA: Diagnosis not present

## 2023-06-24 DIAGNOSIS — Z79899 Other long term (current) drug therapy: Secondary | ICD-10-CM | POA: Diagnosis not present

## 2023-06-24 DIAGNOSIS — E119 Type 2 diabetes mellitus without complications: Secondary | ICD-10-CM | POA: Diagnosis not present

## 2023-06-24 DIAGNOSIS — E1169 Type 2 diabetes mellitus with other specified complication: Secondary | ICD-10-CM | POA: Diagnosis not present

## 2023-06-24 DIAGNOSIS — I700 Atherosclerosis of aorta: Secondary | ICD-10-CM | POA: Diagnosis not present

## 2023-06-24 DIAGNOSIS — E7800 Pure hypercholesterolemia, unspecified: Secondary | ICD-10-CM | POA: Diagnosis not present

## 2023-07-09 NOTE — Progress Notes (Signed)
  Cardiology Office Note:  .   Date:  07/10/2023  ID:  Valerie Short, DOB 1956/08/05, MRN [id number] PCP: Darrow Bussing, MD  [name] Cardiologist:  Donato Schultz, MD {  History of Present Illness: .   Valerie Short is a 67 y.o. female with a past medical history of hypertension, coronary artery disease, diabetes mellitus, hyperlipidemia and thyroid disease who is here for his follow-up appointment.  Last seen 10/14/2022 and reported she was doing well.  Had a calcium score done that was recommended by her primary as preventative medicine.  No issues with her heart at that time.  She was taking hydrochlorothiazide, metoprolol, and metformin.  Started on losartan but did not tolerate the medication.  Unable to lift her arms above shoulder level without pain.  Her mother had a history of atrial fibrillation and her father had open heart surgery at 1 point.  They both passed in the mid 80s.  At her last appointment she denied palpitations, chest pain, shortness of breath, or peripheral edema.  No lightheadedness, headaches, syncope, orthopnea, or PND.  Today, she feels good. She had arthritis in her knees, limits with activity. Plays basketball with nephew but now can't do that. She does some walking and some stretching exercises. 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 She has not had any chest pain or SOB with her activity.   Reports no shortness of breath nor dyspnea on exertion. Reports no chest pain, pressure, or tightness. No edema, orthopnea, PND. Reports no palpitations.    ROS: Pertinent ROS in HPI  Studies Reviewed: Marland Kitchen        Coronary Calcium Score 08/27/2022: IMPRESSION: Coronary calcium score of 121. This was 80th percentile for age-, race-, and sex-matched controls.   Mild aortic atherosclerosis.   Recommend aggressive risk factor modification, including LDL goal <70.     Physical Exam:   VS:  BP (!) 132/56   Pulse 74    Ht 5\' 5"  (1.651 m)   Wt 230 lb 3.2 oz (104.4 kg)   SpO2 99%   BMI 38.31 kg/m    Wt Readings from Last 3 Encounters:  07/10/23 230 lb 3.2 oz (104.4 kg)  10/14/22 249 lb (112.9 kg)    GEN: Well nourished, well developed in no acute distress NECK: No JVD; No carotid bruits CARDIAC: RRR, no murmurs, rubs, gallops RESPIRATORY:  Clear to auscultation without rales, wheezing or rhonchi  ABDOMEN: Soft, non-tender, non-distended EXTREMITIES:  No edema; No deformity   ASSESSMENT AND PLAN: .   1.  Statin intolerance -LDL 43, triglycerides at goal -Continue current medications which include Repatha 540 mg/mL every 14 days and Zetia 10mg  daily -Continue annual monitoring of lipid panel and LFTs  2.  Coronary artery calcification -No chest pain or shortness of breath -Continue current medication regimen which includes Repatha, Zetia, HCTZ, Cozaar, Lopressor  3.  Morbid obesity -She is able to do some walking but her physical activity is limited by arthritis -Continue to increase activity as able and continue a low-sodium, heart healthy diet  4.  Diabetes mellitus with hypertension -Recent A1c was at goal, continue current medication regimen      Dispo: She can follow-up in 6 months with Dr. Anne Fu  Signed, Sharlene Dory, PA-C

## 2023-07-10 ENCOUNTER — Ambulatory Visit: Payer: PPO | Attending: Physician Assistant | Admitting: Physician Assistant

## 2023-07-10 ENCOUNTER — Encounter: Payer: Self-pay | Admitting: Physician Assistant

## 2023-07-10 VITALS — BP 132/56 | HR 74 | Ht 65.0 in | Wt 230.2 lb

## 2023-07-10 DIAGNOSIS — Z789 Other specified health status: Secondary | ICD-10-CM

## 2023-07-10 DIAGNOSIS — I10 Essential (primary) hypertension: Secondary | ICD-10-CM | POA: Diagnosis not present

## 2023-07-10 DIAGNOSIS — E785 Hyperlipidemia, unspecified: Secondary | ICD-10-CM

## 2023-07-10 DIAGNOSIS — I2510 Atherosclerotic heart disease of native coronary artery without angina pectoris: Secondary | ICD-10-CM

## 2023-07-10 DIAGNOSIS — I2584 Coronary atherosclerosis due to calcified coronary lesion: Secondary | ICD-10-CM | POA: Diagnosis not present

## 2023-07-10 DIAGNOSIS — E119 Type 2 diabetes mellitus without complications: Secondary | ICD-10-CM

## 2023-07-10 NOTE — Patient Instructions (Addendum)
Medication Instructions:  Your physician recommends that you continue on your current medications as directed. Please refer to the Current Medication list given to you today. *If you need a refill on your cardiac medications before your next appointment, please call your pharmacy*   Lab Work: NONE ORDERED   Testing/Procedures: None ordered   Follow-Up: At [facility], you and your health needs are our priority.  As part of our continuing mission to provide you with exceptional heart care, we have created designated Provider Care Teams.  These Care Teams include your primary Cardiologist (physician) and Advanced Practice Providers (APPs -  Physician Assistants and Nurse Practitioners) who all work together to provide you with the care you need, when you need it.  We recommend signing up for the patient portal called "MyChart".  Sign up information is provided on this After Visit Summary.  MyChart is used to connect with patients for Virtual Visits (Telemedicine).  Patients are able to view lab/test results, encounter notes, upcoming appointments, etc.  Non-urgent messages can be sent to your provider as well.   To learn more about what you can do with MyChart, go to [email or web address].    Your next appointment:   6 month(s)  Provider:   Donato Schultz, MD     Other Instructions

## 2023-08-10 ENCOUNTER — Other Ambulatory Visit (HOSPITAL_COMMUNITY): Payer: Self-pay

## 2023-08-10 ENCOUNTER — Other Ambulatory Visit: Payer: Self-pay

## 2023-08-10 ENCOUNTER — Telehealth: Payer: Self-pay | Admitting: Pharmacy Technician

## 2023-08-10 MED ORDER — REPATHA SURECLICK 140 MG/ML ~~LOC~~ SOAJ: 140.0000 mg | SUBCUTANEOUS | 3 refills | Status: DC

## 2023-08-10 NOTE — Telephone Encounter (Signed)
Pharmacy Patient Advocate Encounter   Received notification from CoverMyMeds that prior authorization for repatha is required/requested.   Insurance verification completed.   The patient is insured through [employer] .   Per test claim: PA required; PA submitted to [employer] via CoverMyMeds Key/confirmation #/[facility] Status is pending

## 2023-08-10 NOTE — Telephone Encounter (Signed)
Pharmacy Patient Advocate Encounter  Received notification from [employer] that Prior Authorization for repatha has been APPROVED from 08/10/23 to 08/09/24. Ran test claim, Copay is $134.65. This test claim was processed through [facility]- copay amounts may vary at other pharmacies due to pharmacy/plan contracts, or as the patient moves through the different stages of their insurance plan.   PA #/Case ID/Reference #: [id number]

## 2023-10-09 ENCOUNTER — Other Ambulatory Visit: Payer: Self-pay | Admitting: Cardiology

## 2024-01-12 ENCOUNTER — Telehealth: Payer: Self-pay | Admitting: Cardiology

## 2024-01-12 MED ORDER — REPATHA SURECLICK 140 MG/ML ~~LOC~~ SOAJ: 140.0000 mg | SUBCUTANEOUS | 3 refills | Status: DC

## 2024-01-12 NOTE — Telephone Encounter (Signed)
°*  STAT* If patient is at the pharmacy, call can be transferred to refill team.   1. Which medications need to be refilled? (please list name of each medication and dose if known) Evolocumab (REPATHA SURECLICK) 485 MG/ML SOAJ  2. Which pharmacy/location (including street and city if local pharmacy) is medication to be sent to? [id number] - [place], [place] - [address]  3. Do they need a 30 day or 90 day supply? [place]

## 2024-01-21 ENCOUNTER — Ambulatory Visit: Payer: PPO | Attending: Physician Assistant | Admitting: Physician Assistant

## 2024-01-21 ENCOUNTER — Encounter: Payer: Self-pay | Admitting: Physician Assistant

## 2024-01-21 VITALS — BP 122/70 | HR 89 | Ht 65.0 in | Wt 229.0 lb

## 2024-01-21 DIAGNOSIS — I2510 Atherosclerotic heart disease of native coronary artery without angina pectoris: Secondary | ICD-10-CM | POA: Diagnosis not present

## 2024-01-21 DIAGNOSIS — Z789 Other specified health status: Secondary | ICD-10-CM | POA: Diagnosis not present

## 2024-01-21 DIAGNOSIS — E785 Hyperlipidemia, unspecified: Secondary | ICD-10-CM

## 2024-01-21 DIAGNOSIS — I10 Essential (primary) hypertension: Secondary | ICD-10-CM | POA: Diagnosis not present

## 2024-01-21 DIAGNOSIS — E119 Type 2 diabetes mellitus without complications: Secondary | ICD-10-CM

## 2024-01-21 NOTE — Progress Notes (Signed)
 Cardiology Office Note:  .   Date:  01/21/2024  ID:  Valerie Short, DOB 05-31-1956, MRN [id number] PCP: Darrow Bussing, MD   [facility] Providers Cardiologist:  Donato Schultz, MD {  History of Present Illness: .   Valerie Short is a 68 y.o. female with a past medical history of hypertension, coronary artery disease, diabetes mellitus, hyperlipidemia and thyroid disease who is here for his follow-up appointment.  Last seen 10/14/2022 and reported she was doing well.  Had a calcium score done that was recommended by her primary as preventative medicine.  No issues with her heart at that time.  She was taking hydrochlorothiazide, metoprolol, and metformin.  Started on losartan but did not tolerate the medication.  Unable to lift her arms above shoulder level without pain.  Her mother had a history of atrial fibrillation and her father had open heart surgery at 1 point.  They both passed in the mid 80s.  At her last appointment she denied palpitations, chest pain, shortness of breath, or peripheral edema.  No lightheadedness, headaches, syncope, orthopnea, or PND.  I saw her 06/2023, she feels good. She had arthritis in her knees, limits with activity. Plays basketball with nephew but now can't do that. She does some walking and some stretching exercises. 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 She has not had any chest pain or SOB with her activity.   Reports no shortness of breath nor dyspnea on exertion. Reports no chest pain, pressure, or tightness. No edema, orthopnea, PND. Reports no palpitations.   Today, she has been well without any chest pain or shortness of breath.  Continues to remain active.  He denies lower extremity swelling, lightheadedness and dizziness.  Labs reviewed from a month ago.  LDL 28.  Continue Repatha.  TSH normal.  A1c down to 6.4 from 6.7. Overall, doing well.  Reports no shortness of breath nor dyspnea on exertion. Reports no  chest pain, pressure, or tightness. No edema, orthopnea, PND. Reports no palpitations.   Discussed the use of AI scribe software for clinical note transcription with the patient, who gave verbal consent to proceed.  ROS: Pertinent ROS in HPI  Studies Reviewed: Marland Kitchen   EKG Interpretation Date/Time:  Thursday January 21 2024 14:25:54 EST Ventricular Rate:  84 PR Interval:  132 QRS Duration:  80 QT Interval:  362 QTC Calculation: 427 R Axis:   60  Text Interpretation: Normal sinus rhythm Normal ECG No previous ECGs available Confirmed by Jari Favre [id number]) on 01/21/2024 2:44:03 PM    Coronary Calcium Score 08/27/2022: IMPRESSION: Coronary calcium score of 121. This was 80th percentile for age-, race-, and sex-matched controls.   Mild aortic atherosclerosis.   Recommend aggressive risk factor modification, including LDL goal <70.     Physical Exam:   VS:  BP 122/70   Pulse 89   Ht 5\' 5"  (1.651 m)   Wt 229 lb (103.9 kg)   SpO2 97%   BMI 38.11 kg/m    Wt Readings from Last 3 Encounters:  01/21/24 229 lb (103.9 kg)  07/10/23 230 lb 3.2 oz (104.4 kg)  10/14/22 249 lb (112.9 kg)    GEN: Well nourished, well developed in no acute distress NECK: No JVD; No carotid bruits CARDIAC: RRR, no murmurs, rubs, gallops RESPIRATORY:  Clear to auscultation without rales, wheezing or rhonchi  ABDOMEN: Soft, non-tender, non-distended EXTREMITIES:  No edema; No deformity   ASSESSMENT AND PLAN: .  Coronary Artery Disease Stable with no reported chest pain. LDL well controlled on Repatha and Zetia with a recent LDL of 28. -Continue current medications:Repatha and Zetia 10mg  daily.  Type 2 Diabetes Mellitus Recent A1c of 6.4, trending down from 6.7. -Continue current management and monitor A1c.  Hypothyroidism Recent TSH of 1.9, within normal limits. -Continue current thyroid management.  Statin intolerance/HLD -LDL 28, triglycerides at goal -Continue current medications which include  Repatha 140 mg/mL every 14 days and Zetia 10mg  daily -Continue annual monitoring of lipid panel and LFTs  Coronary artery calcification -No chest pain or shortness of breath -Continue current medication regimen which includes Repatha, Zetia, HCTZ, Cozaar, Lopressor      Dispo: She can follow-up in 1 year with Dr. Anne Fu  Signed, Sharlene Dory, PA-C

## 2024-01-21 NOTE — Patient Instructions (Signed)
 Medication Instructions:    Your physician recommends that you continue on your current medications as directed. Please refer to the Current Medication list given to you today.   *If you need a refill on your cardiac medications before your next appointment, please call your pharmacy*   Lab Work: NONE ORDERED  TODAY     If you have labs (blood work) drawn today and your tests are completely normal, you will receive your results only by: MyChart Message (if you have MyChart) OR A paper copy in the mail If you have any lab test that is abnormal or we need to change your treatment, we will call you to review the results.   Testing/Procedures: NONE ORDERED  TODAY     Follow-Up: At [facility], you and your health needs are our priority.  As part of our continuing mission to provide you with exceptional heart care, we have created designated Provider Care Teams.  These Care Teams include your primary Cardiologist (physician) and Advanced Practice Providers (APPs -  Physician Assistants and Nurse Practitioners) who all work together to provide you with the care you need, when you need it.  We recommend signing up for the patient portal called "MyChart".  Sign up information is provided on this After Visit Summary.  MyChart is used to connect with patients for Virtual Visits (Telemedicine).  Patients are able to view lab/test results, encounter notes, upcoming appointments, etc.  Non-urgent messages can be sent to your provider as well.   To learn more about what you can do with MyChart, go to [email or web address].    Your next appointment:    1 year(s)  Provider:    Donato Schultz, MD      Other Instructions    1st Floor: - Lobby - Registration  - Pharmacy  - Lab - Cafe  2nd Floor: - PV Lab - Diagnostic Testing (echo, CT, nuclear med)  3rd Floor: - Vacant  4th Floor: - TCTS (cardiothoracic surgery) - AFib Clinic - Structural Heart Clinic - Vascular  Surgery  - Vascular Ultrasound  5th Floor: - [facility]Care Cardiology (general and EP) - Clinical Pharmacy for coumadin, hypertension, lipid, weight-loss medications, and med management appointments    Valet parking services will be available as well.

## 2024-02-03 ENCOUNTER — Other Ambulatory Visit: Payer: Self-pay | Admitting: Family Medicine

## 2024-02-03 DIAGNOSIS — Z1231 Encounter for screening mammogram for malignant neoplasm of breast: Secondary | ICD-10-CM

## 2024-02-12 DIAGNOSIS — H43393 Other vitreous opacities, bilateral: Secondary | ICD-10-CM | POA: Diagnosis not present

## 2024-02-12 DIAGNOSIS — Z7984 Long term (current) use of oral hypoglycemic drugs: Secondary | ICD-10-CM | POA: Diagnosis not present

## 2024-02-12 DIAGNOSIS — E119 Type 2 diabetes mellitus without complications: Secondary | ICD-10-CM | POA: Diagnosis not present

## 2024-02-12 DIAGNOSIS — H2513 Age-related nuclear cataract, bilateral: Secondary | ICD-10-CM | POA: Diagnosis not present

## 2024-02-25 ENCOUNTER — Ambulatory Visit
Admission: RE | Admit: 2024-02-25 | Discharge: 2024-02-25 | Disposition: A | Discharge status: Home / Self Care | Source: Ambulatory Visit | Attending: Family Medicine | Admitting: Family Medicine

## 2024-02-25 DIAGNOSIS — Z1231 Encounter for screening mammogram for malignant neoplasm of breast: Secondary | ICD-10-CM | POA: Diagnosis not present

## 2024-05-29 ENCOUNTER — Other Ambulatory Visit: Payer: Self-pay | Admitting: Cardiology

## 2024-08-30 ENCOUNTER — Other Ambulatory Visit: Payer: Self-pay

## 2024-08-30 ENCOUNTER — Telehealth: Payer: Self-pay | Admitting: Pharmacy Technician

## 2024-08-30 DIAGNOSIS — E785 Hyperlipidemia, unspecified: Secondary | ICD-10-CM

## 2024-08-30 DIAGNOSIS — Z789 Other specified health status: Secondary | ICD-10-CM

## 2024-08-30 DIAGNOSIS — Z79899 Other long term (current) drug therapy: Secondary | ICD-10-CM

## 2024-08-30 NOTE — Telephone Encounter (Signed)
 Hi, can this patient get a updated lipid panel for the prior authorization for repatha ? Ins is requiring labs in the last 120 days and the last labs I see are from 01/06/2024. Thank you

## 2024-09-07 ENCOUNTER — Other Ambulatory Visit: Payer: Self-pay | Admitting: *Deleted

## 2024-09-07 DIAGNOSIS — Z789 Other specified health status: Secondary | ICD-10-CM

## 2024-09-07 DIAGNOSIS — E785 Hyperlipidemia, unspecified: Secondary | ICD-10-CM

## 2024-09-07 DIAGNOSIS — Z79899 Other long term (current) drug therapy: Secondary | ICD-10-CM

## 2024-09-07 NOTE — Telephone Encounter (Signed)
 Lipid panel was ordered 08/30/2024 as documented.  Appts are not scheduled for lab work.  Pt can report to their closest [facility] for blood draw.  Pt is aware she needs to come in for lab work.

## 2024-09-07 NOTE — Telephone Encounter (Signed)
 Erminio with [facility] called in for update on pt's PA. Not sure if pt is aware she needs to have lipid panel. Please advise.

## 2024-09-09 DIAGNOSIS — Z789 Other specified health status: Secondary | ICD-10-CM | POA: Diagnosis not present

## 2024-09-09 DIAGNOSIS — E785 Hyperlipidemia, unspecified: Secondary | ICD-10-CM | POA: Diagnosis not present

## 2024-09-09 LAB — LIPID PANEL
Chol/HDL Ratio: 2.7 ratio (ref 0.0–4.4)
Cholesterol, Total: 107 mg/dL (ref 100–199)
HDL: 39 mg/dL — ABNORMAL LOW (ref >39)
LDL Chol Calc (NIH): 49 mg/dL (ref 0–99)
Triglycerides: 101 mg/dL (ref 0–149)
VLDL Cholesterol Cal: 19 mg/dL (ref 5–40)

## 2024-09-10 ENCOUNTER — Ambulatory Visit: Payer: Self-pay | Admitting: Cardiology

## 2024-09-12 ENCOUNTER — Other Ambulatory Visit (HOSPITAL_COMMUNITY): Payer: Self-pay

## 2024-09-12 NOTE — Telephone Encounter (Signed)
 Pts insurance [employer] states the pts plan can only cover per 28 days but the script is for for 30 days.

## 2024-09-12 NOTE — Telephone Encounter (Signed)
 Pharmacy Patient Advocate Encounter   Received notification from Patient Pharmacy that prior authorization for REPATHA  is required/requested.   Insurance verification completed.   The patient is insured through [facility] ADVANTAGE/RX ADVANCE.   Per test claim: PA required; PA submitted to above mentioned insurance via Latent Key/confirmation #/EOC [id number] Status is pending

## 2024-09-13 NOTE — Telephone Encounter (Signed)
 Pharmacy Patient Advocate Encounter  Received notification from [facility] [employer]/RX ADVANCE that Prior Authorization for REPATHA  has been APPROVED from 09/12/24 to 09/12/25

## 2024-09-26 ENCOUNTER — Telehealth: Payer: Self-pay | Admitting: Physician Assistant

## 2024-09-26 DIAGNOSIS — E785 Hyperlipidemia, unspecified: Secondary | ICD-10-CM

## 2024-09-26 DIAGNOSIS — I2510 Atherosclerotic heart disease of native coronary artery without angina pectoris: Secondary | ICD-10-CM

## 2024-09-26 NOTE — Telephone Encounter (Signed)
*  STAT* If patient is at the pharmacy, call can be transferred to refill team.   1. Which medications need to be refilled? (please list name of each medication and dose if known) Evolocumab  (REPATHA  SURECLICK) 140 MG/ML SOAJ    4. Which pharmacy/location (including street and city if local pharmacy) is medication to be sent to?  [id number] - [place], [place] - [address]     5. Do they need a 30 day or 90 day supply? 90

## 2024-09-27 MED ORDER — REPATHA SURECLICK 140 MG/ML ~~LOC~~ SOAJ: 140.0000 mg | SUBCUTANEOUS | 1 refills | Status: AC
